# Patient Record
Sex: Male | Born: 1975 | Race: White | Hispanic: No | Marital: Married | Smoking: Never smoker
Health system: Southern US, Community
[De-identification: ages and names within clinical notes are randomized; demographics above are authoritative.]

## PROBLEM LIST (undated history)

## (undated) DIAGNOSIS — K219 Gastro-esophageal reflux disease without esophagitis: Secondary | ICD-10-CM

## (undated) DIAGNOSIS — G4733 Obstructive sleep apnea (adult) (pediatric): Secondary | ICD-10-CM

## (undated) DIAGNOSIS — I10 Essential (primary) hypertension: Secondary | ICD-10-CM

## (undated) HISTORY — PX: TONSILLECTOMY: SUR1361

---

## 2006-05-06 ENCOUNTER — Emergency Department (HOSPITAL_COMMUNITY): Admission: EM | Admit: 2006-05-06 | Discharge: 2006-05-06 | Payer: Self-pay | Admitting: Emergency Medicine

## 2006-06-25 ENCOUNTER — Emergency Department (HOSPITAL_COMMUNITY): Admission: EM | Admit: 2006-06-25 | Discharge: 2006-06-25 | Payer: Self-pay | Admitting: Emergency Medicine

## 2006-10-01 ENCOUNTER — Emergency Department (HOSPITAL_COMMUNITY): Admission: EM | Admit: 2006-10-01 | Discharge: 2006-10-01 | Payer: Self-pay | Admitting: Emergency Medicine

## 2007-08-22 ENCOUNTER — Emergency Department (HOSPITAL_COMMUNITY): Admission: EM | Admit: 2007-08-22 | Discharge: 2007-08-22 | Payer: Self-pay | Admitting: Emergency Medicine

## 2007-10-28 ENCOUNTER — Emergency Department (HOSPITAL_COMMUNITY): Admission: EM | Admit: 2007-10-28 | Discharge: 2007-10-28 | Payer: Self-pay | Admitting: Family Medicine

## 2009-04-15 ENCOUNTER — Emergency Department (HOSPITAL_COMMUNITY): Admission: EM | Admit: 2009-04-15 | Discharge: 2009-04-15 | Payer: Self-pay | Admitting: Emergency Medicine

## 2010-07-15 ENCOUNTER — Emergency Department (HOSPITAL_COMMUNITY)
Admission: EM | Admit: 2010-07-15 | Discharge: 2010-07-15 | Disposition: A | Discharge status: Home / Self Care | Payer: PRIVATE HEALTH INSURANCE | Attending: Emergency Medicine | Admitting: Emergency Medicine

## 2010-07-15 ENCOUNTER — Encounter (HOSPITAL_COMMUNITY): Payer: Self-pay | Admitting: Radiology

## 2010-07-15 ENCOUNTER — Emergency Department (HOSPITAL_COMMUNITY): Payer: PRIVATE HEALTH INSURANCE

## 2010-07-15 DIAGNOSIS — K7689 Other specified diseases of liver: Secondary | ICD-10-CM | POA: Insufficient documentation

## 2010-07-15 DIAGNOSIS — D72829 Elevated white blood cell count, unspecified: Secondary | ICD-10-CM | POA: Insufficient documentation

## 2010-07-15 DIAGNOSIS — R1031 Right lower quadrant pain: Secondary | ICD-10-CM | POA: Insufficient documentation

## 2010-07-15 LAB — COMPREHENSIVE METABOLIC PANEL
ALT: 87 U/L — ABNORMAL HIGH (ref 0–53)
AST: 42 U/L — ABNORMAL HIGH (ref 0–37)
Alkaline Phosphatase: 106 U/L (ref 39–117)
CO2: 24 mEq/L (ref 19–32)
GFR calc Af Amer: >60 mL/min (ref >60)
GFR calc non Af Amer: >60 mL/min (ref >60)
Glucose, Bld: 106 mg/dL — ABNORMAL HIGH (ref 70–99)
Potassium: 3.6 mEq/L (ref 3.5–5.1)
Sodium: 136 mEq/L (ref 135–145)

## 2010-07-15 LAB — URINALYSIS, ROUTINE W REFLEX MICROSCOPIC
Glucose, UA: NEGATIVE mg/dL
Hgb urine dipstick: NEGATIVE
Leukocytes, UA: NEGATIVE
Specific Gravity, Urine: 1.019 (ref 1.005–1.030)
pH: 6 (ref 5.0–8.0)

## 2010-07-15 LAB — CBC
HCT: 44.2 % (ref 39.0–52.0)
MCHC: 33.5 g/dL (ref 30.0–36.0)
MCV: 86.2 fL (ref 78.0–100.0)
Platelets: 210 10*3/uL (ref 150–400)
RDW: 13.3 % (ref 11.5–15.5)
WBC: 11.9 10*3/uL — ABNORMAL HIGH (ref 4.0–10.5)

## 2010-07-15 LAB — DIFFERENTIAL
Basophils Absolute: 0.1 10*3/uL (ref 0.0–0.1)
Eosinophils Absolute: 0.2 10*3/uL (ref 0.0–0.7)
Eosinophils Relative: 2 % (ref 0–5)
Lymphs Abs: 2.9 10*3/uL (ref 0.7–4.0)
Monocytes Absolute: 0.9 10*3/uL (ref 0.1–1.0)

## 2010-07-15 MED ORDER — IOHEXOL 300 MG/ML  SOLN
100.0000 mL | Freq: Once | INTRAMUSCULAR | Status: AC | PRN
  Administered 2010-07-15: 100 mL via INTRAVENOUS

## 2010-07-16 LAB — URINE CULTURE: Culture  Setup Time: 201207020858

## 2011-01-10 ENCOUNTER — Other Ambulatory Visit: Payer: Self-pay | Admitting: Family Medicine

## 2011-01-21 ENCOUNTER — Ambulatory Visit
Admission: RE | Admit: 2011-01-21 | Discharge: 2011-01-21 | Disposition: A | Discharge status: Home / Self Care | Payer: PRIVATE HEALTH INSURANCE | Source: Ambulatory Visit | Attending: Family Medicine | Admitting: Family Medicine

## 2014-09-21 ENCOUNTER — Emergency Department (HOSPITAL_COMMUNITY): Payer: No Typology Code available for payment source

## 2014-09-21 ENCOUNTER — Encounter (HOSPITAL_COMMUNITY): Payer: Self-pay | Admitting: Emergency Medicine

## 2014-09-21 ENCOUNTER — Emergency Department (HOSPITAL_COMMUNITY)
Admission: EM | Admit: 2014-09-21 | Discharge: 2014-09-21 | Disposition: A | Discharge status: Home / Self Care | Payer: No Typology Code available for payment source | Attending: Emergency Medicine | Admitting: Emergency Medicine

## 2014-09-21 DIAGNOSIS — Z79899 Other long term (current) drug therapy: Secondary | ICD-10-CM | POA: Diagnosis not present

## 2014-09-21 DIAGNOSIS — M7732 Calcaneal spur, left foot: Secondary | ICD-10-CM | POA: Insufficient documentation

## 2014-09-21 DIAGNOSIS — M7731 Calcaneal spur, right foot: Secondary | ICD-10-CM | POA: Insufficient documentation

## 2014-09-21 DIAGNOSIS — M79671 Pain in right foot: Secondary | ICD-10-CM | POA: Diagnosis present

## 2014-09-21 MED ORDER — IBUPROFEN 200 MG PO TABS
600.0000 mg | ORAL_TABLET | Freq: Once | ORAL | Status: AC
  Administered 2014-09-21: 600 mg via ORAL
  Filled 2014-09-21: qty 3

## 2014-09-21 MED ORDER — LANSOPRAZOLE 30 MG PO CPDR: 30.0000 mg | DELAYED_RELEASE_CAPSULE | Freq: Every day | ORAL | Status: DC

## 2014-09-21 MED ORDER — IBUPROFEN 600 MG PO TABS: 600.0000 mg | ORAL_TABLET | Freq: Three times a day (TID) | ORAL | Status: DC

## 2014-09-21 NOTE — ED Notes (Signed)
Patient transported to X-ray 

## 2014-09-21 NOTE — ED Notes (Signed)
Pt c/o bilat feet pain, mostly centered around heel area. Worse after working all day. Better with rest.

## 2014-09-21 NOTE — ED Provider Notes (Signed)
CSN: [id number]     Arrival date & time 09/21/14  0327 History   First MD Initiated Contact with Patient 09/21/14 [phone]     Chief Complaint  Patient presents with  . Foot Pain     (Consider location/radiation/quality/duration/timing/severity/associated sxs/prior Treatment) HPI Patient presents with several months of bilateral foot pain. There is no been no acute injury. Pain is worse with prolonged standing. [place] of the [place]. Denies any focal swelling to the area of pain. No redness. No masses. History reviewed. No pertinent past medical history. History reviewed. No pertinent past surgical history. No family history on file. Social History  Substance Use Topics  . Smoking status: Never Smoker   . Smokeless tobacco: None  . Alcohol Use: Yes    Review of Systems  Cardiovascular: Negative for leg swelling.  Musculoskeletal: Positive for arthralgias.  Skin: Negative for rash and wound.  Neurological: Negative for weakness and numbness.  All other systems reviewed and are negative.     Allergies  Milk-related compounds and Wheat bran  Home Medications   Prior to Admission medications   Medication Sig Start Date End Date Taking? Authorizing Provider  Aspirin-Caffeine (BAYER BACK & BODY) 500-32.5 MG TABS Take 2-3 tablets by mouth daily.   Yes Historical Provider, MD  omeprazole (PRILOSEC) 20 MG capsule Take 20 mg by mouth daily.   Yes Historical Provider, MD  ibuprofen (ADVIL,MOTRIN) 600 MG tablet Take 1 tablet (600 mg total) by mouth 3 (three) times daily after meals. 09/21/14   Loren Racer, MD   BP 137/99 mmHg  Pulse 84  Temp(Src) 98.2 F (36.8 C) (Oral)  Resp 16  Ht 6' (1.829 m)  Wt 238 lb (107.956 kg)  BMI 32.27 kg/m2  SpO2 98% Physical Exam  Constitutional: He is oriented to person, place, and time. He appears well-developed and well-nourished. No distress.  HENT:  Head: Normocephalic and atraumatic.  Eyes: Pupils  are equal, round, and reactive to light.  Neck: Normal range of motion. Neck supple.  Cardiovascular: Normal rate.   Pulmonary/Chest: Effort normal.  Abdominal: Soft. Bowel sounds are normal.  Musculoskeletal: Normal range of motion. He exhibits tenderness. He exhibits no edema.  Patient with tenderness to the plantar surface of bilateral feet just proximal to the calcaneal bones. There is no obvious injury. No erythema. No warmth. No masses. Foot is warm. Dorsalis pedis pulses intact. Good cap refill  Neurological: He is alert and oriented to person, place, and time.  Ambulating without difficulty. 5/5 motor in all extremities. Sensation intact.  Skin: Skin is warm and dry. No rash noted. No erythema.  Psychiatric: He has a normal mood and affect. His behavior is normal.  Nursing note and vitals reviewed.   ED Course  Procedures (including critical care time) Labs Review Labs Reviewed - No data to display  Imaging Review Dg Foot Complete Left  09/21/2014   CLINICAL DATA:  Bilateral heel pain for 2 months.  No trauma.  EXAM: LEFT FOOT - COMPLETE 3+ VIEW  COMPARISON:  None.  FINDINGS: There is no evidence of fracture or dislocation. There is no evidence of arthropathy or other focal bone abnormality. Soft tissues are unremarkable. Small plantar calcaneal spur.  IMPRESSION: No acute bony abnormalities.   Electronically Signed   By: Burman Nieves M.D.   On: 09/21/2014 06:15   Dg Foot Complete Right  09/21/2014   CLINICAL DATA:  Bilateral heel pain for 2 months.  No  known trauma.  EXAM: RIGHT FOOT COMPLETE - 3+ VIEW  COMPARISON:  None.  FINDINGS: There is no evidence of fracture or dislocation. There is no evidence of arthropathy or other focal bone abnormality. Soft tissues are unremarkable. Small plantar calcaneal spur.  IMPRESSION: No acute bony abnormalities.   Electronically Signed   By: Burman Nieves M.D.   On: 09/21/2014 06:15   I have personally reviewed and evaluated these images  and lab results as part of my medical decision-making.   EKG Interpretation None      MDM   Final diagnoses:  Calcaneal spur of both feet    Patient advised to follow-up with podiatry.    Loren Racer, MD 09/21/14 [phone]

## 2014-09-21 NOTE — Discharge Instructions (Signed)
Heel Spur A heel spur is a hook of bone that can form on the calcaneus (the heel bone and the largest bone of the foot). Heel spurs are often associated with plantar fasciitis and usually come in people who have had the problem for an extended period of time. The cause of the relationship is unknown. The pain associated with them is thought to be caused by an inflammation (soreness and redness) of the plantar fascia rather than the spur itself. The plantar fascia is a thick fibrous like tissue that runs from the calcaneus (heel bone) to the ball of the foot. This strong, tight tissue helps maintain the arch of your foot. It helps distribute the weight across your foot as you walk or run. Stresses placed on the plantar fascia can be tremendous. When it is inflamed normal activities become painful. Pain is worse in the morning after sleeping. After sleeping the plantar fascia is tight. The first movements stretch the fascia and this causes pain. As the tendon loosens, the pain usually gets better. It often returns with too much standing or walking.  About 70% of patients with plantar fasciitis have a heel spur. About half of people without foot pain also have heel spurs. DIAGNOSIS  The diagnosis of a heel spur is made by X-ray. The X-ray shows a hook of bone protruding from the bottom of the calcaneus at the point where the plantar fascia is attached to the heel bone.  TREATMENT  It is necessary to find out what is causing the stretching of the plantar fascia. If the cause is over-pronation (flat feet), orthotics and proper foot ware may help.  Stretching exercises, losing weight, wearing shoes that have a cushioned heel that absorbs shock, and elevating the heel with the use of a heel cradle, heel cup, or orthotics may all help. Heel cradles and heel cups provide extra comfort and cushion to the heel, and reduce the amount of shock to the sore area. AVOIDING THE PAIN OF PLANTAR FASCIITIS AND HEEL  SPURS  Consult a sports medicine professional before beginning a new exercise program.  Walking programs offer a good workout. There is a lower chance of overuse injuries common to the runners. There is less impact and less jarring of the joints.  Begin all new exercise programs slowly. If problems or pains develop, decrease the amount of time or distance until you are at a comfortable level.  Wear good shoes and replace them regularly.  Stretch your foot and the heel cords at the back of the ankle (Achilles tendons) both before and after exercise.  Run or exercise on even surfaces that are not hard. For example, asphalt is better than pavement.  Do not run barefoot on hard surfaces.  If using a treadmill, vary the incline.  Do not continue to workout if you have foot or joint problems. Seek professional help if they do not improve. HOME CARE INSTRUCTIONS   Avoid activities that cause you pain until you recover.  Use ice or cold packs to the problem or painful areas after working out.  Only take over-the-counter or prescription medicines for pain, discomfort, or fever as directed by your caregiver.  Soft shoe inserts or athletic shoes with air or gel sole cushions may be helpful.  If problems continue or become more severe, consult a sports medicine caregiver. Cortisone is a potent anti-inflammatory medication that may be injected into the painful area. You can discuss this treatment with your caregiver. MAKE SURE YOU:     Understand these instructions.  Will watch your condition.  Will get help right away if you are not doing well or get worse. Document Released: 02/05/2005 Document Revised: 03/24/2011 Document Reviewed: 03/02/2013 [employer] Patient Information 2015 [employer]. This information is not intended to replace advice given to you by your health care provider. Make sure you discuss any questions you have with your health care provider.  

## 2014-09-28 ENCOUNTER — Ambulatory Visit: Payer: Self-pay

## 2014-09-28 ENCOUNTER — Ambulatory Visit (INDEPENDENT_AMBULATORY_CARE_PROVIDER_SITE_OTHER): Payer: PRIVATE HEALTH INSURANCE | Admitting: Podiatry

## 2014-09-28 ENCOUNTER — Encounter: Payer: Self-pay | Admitting: Podiatry

## 2014-09-28 VITALS — BP 128/83 | HR 74 | Resp 16

## 2014-09-28 DIAGNOSIS — M722 Plantar fascial fibromatosis: Secondary | ICD-10-CM

## 2014-09-28 DIAGNOSIS — M79673 Pain in unspecified foot: Secondary | ICD-10-CM

## 2014-09-28 MED ORDER — TRIAMCINOLONE ACETONIDE 10 MG/ML IJ SUSP
10.0000 mg | Freq: Once | INTRAMUSCULAR | Status: AC
  Administered 2014-09-28: 10 mg

## 2014-09-28 NOTE — Patient Instructions (Signed)

## 2014-09-28 NOTE — Progress Notes (Signed)
   Subjective:    Patient ID: Hector Reeves, male    DOB: July 22, 1975, 39 y.o.   MRN: [id number]  HPI  Pt presents with bilateral foot pain in heels and ball of feet, lasting 1 month.   Review of Systems  All other systems reviewed and are negative.      Objective:   Physical Exam        Assessment & Plan:

## 2014-09-29 NOTE — Progress Notes (Signed)
Subjective:     Patient ID: Hector Reeves, male   DOB: [birth year], 39 y.o.   MRN: [id number]  HPI patient presents with pain in his arches of both feet and states that he was on his feet for significant periods of time over the last few months with pain occurring after this activity   Review of Systems  All other systems reviewed and are negative.      Objective:   Physical Exam  Constitutional: He is oriented to person, place, and time.  Cardiovascular: Intact distal pulses.   Musculoskeletal: Normal range of motion.  Neurological: He is oriented to person, place, and time.  Skin: Skin is warm.  Nursing note and vitals reviewed.  neurovascular status intact muscle strength adequate range of motion within normal limits with patient noted to have discomfort in the plantar arch distal to the insertion to the calcaneus bilateral. Moderate depression of the arch is noted and the patient is found to have good digital perfusion and is well oriented 3     Assessment:     Acute plantar fasciitis most likely brought on by foot structure and excessive activity    Plan:     H&P and x-rays reviewed with patient. Distal injections administered bilateral 3 mg Kenalog 5 mg Xylocaine and applied fascial brace bilateral with instructions on usage. Placed on anti-inflammatory and gave instructions on physical therapy

## 2014-10-12 ENCOUNTER — Encounter: Payer: Self-pay | Admitting: Podiatry

## 2014-10-12 ENCOUNTER — Ambulatory Visit (INDEPENDENT_AMBULATORY_CARE_PROVIDER_SITE_OTHER): Payer: No Typology Code available for payment source | Admitting: Podiatry

## 2014-10-12 VITALS — BP 134/93 | HR 68 | Resp 16

## 2014-10-12 DIAGNOSIS — M722 Plantar fascial fibromatosis: Secondary | ICD-10-CM | POA: Diagnosis not present

## 2014-10-12 DIAGNOSIS — M79673 Pain in unspecified foot: Secondary | ICD-10-CM

## 2014-10-12 NOTE — Progress Notes (Signed)
Subjective:     Patient ID: Hector Reeves, male   DOB: 09-07-1975, 39 y.o.   MRN: [id number]  HPI patient states I'm improved but still having some pain especially when I have to stand at work   Review of Systems     Objective:   Physical Exam Neurovascular status intact muscle strength adequate with mid arch pain noted bilateral which is present but improved from previous with moderate depression of the arch noted upon weightbearing    Assessment:     Plantar fasciitis bilateral that's improved mid arch but still painful with pathological structure of foot    Plan:     Reviewed condition and importance of physical therapy not going barefoot and supportive shoe gear usage. Scanned for custom orthotics to reduce the stress on the feet and will be seen back when those are ready or earlier if any issues should occur

## 2014-11-03 ENCOUNTER — Ambulatory Visit: Payer: PRIVATE HEALTH INSURANCE | Admitting: *Deleted

## 2014-11-03 DIAGNOSIS — M722 Plantar fascial fibromatosis: Secondary | ICD-10-CM

## 2014-11-03 NOTE — Progress Notes (Signed)
Patient ID: Hector HalonJason P Reeves, male   DOB: [birth year], 39 y.o.   MRN: [id number] Patient presents for orthotic pick up.  Verbal and written break in and wear instructions given.  Patient will follow up in 4 weeks if symptoms worsen or fail to improve.

## 2014-11-03 NOTE — Patient Instructions (Signed)

## 2014-12-01 ENCOUNTER — Ambulatory Visit (INDEPENDENT_AMBULATORY_CARE_PROVIDER_SITE_OTHER): Payer: PRIVATE HEALTH INSURANCE | Admitting: Podiatry

## 2014-12-01 ENCOUNTER — Encounter: Payer: Self-pay | Admitting: Podiatry

## 2014-12-01 VITALS — BP 157/98 | HR 71 | Resp 16

## 2014-12-01 DIAGNOSIS — M722 Plantar fascial fibromatosis: Secondary | ICD-10-CM

## 2014-12-02 NOTE — Progress Notes (Signed)
Subjective:     Patient ID: Hector Reeves, male   DOB: 09-Jul-1975, 39 y.o.   MRN: [id number]  HPI patient states my heels are feeling quite a bit better with diminished discomfort and inability to walk distances without pain   Review of Systems     Objective:   Physical Exam Neurovascular status intact muscle strength adequate range of motion within normal limits with patient noted to have significant diminishment of discomfort in the heel region bilateral and into the arch    Assessment:     Plantar fascial symptomatology bilateral which is continuing to improve with treatment    Plan:     Advised on physical therapy anti-inflammatory therapy shoe gear modifications and continued orthotic usage. Patient will be seen back as needed or if symptoms were to reoccur

## 2015-12-17 ENCOUNTER — Encounter (HOSPITAL_COMMUNITY): Payer: Self-pay | Admitting: *Deleted

## 2015-12-17 ENCOUNTER — Emergency Department (HOSPITAL_COMMUNITY)
Admission: EM | Admit: 2015-12-17 | Discharge: 2015-12-17 | Disposition: A | Discharge status: Home / Self Care | Payer: No Typology Code available for payment source | Attending: Emergency Medicine | Admitting: Emergency Medicine

## 2015-12-17 DIAGNOSIS — Y999 Unspecified external cause status: Secondary | ICD-10-CM | POA: Insufficient documentation

## 2015-12-17 DIAGNOSIS — Z79899 Other long term (current) drug therapy: Secondary | ICD-10-CM | POA: Insufficient documentation

## 2015-12-17 DIAGNOSIS — Z7982 Long term (current) use of aspirin: Secondary | ICD-10-CM | POA: Diagnosis not present

## 2015-12-17 DIAGNOSIS — S299XXA Unspecified injury of thorax, initial encounter: Secondary | ICD-10-CM | POA: Diagnosis present

## 2015-12-17 DIAGNOSIS — Y929 Unspecified place or not applicable: Secondary | ICD-10-CM | POA: Insufficient documentation

## 2015-12-17 DIAGNOSIS — S29012A Strain of muscle and tendon of back wall of thorax, initial encounter: Secondary | ICD-10-CM | POA: Insufficient documentation

## 2015-12-17 DIAGNOSIS — Y939 Activity, unspecified: Secondary | ICD-10-CM | POA: Insufficient documentation

## 2015-12-17 DIAGNOSIS — X509XXA Other and unspecified overexertion or strenuous movements or postures, initial encounter: Secondary | ICD-10-CM | POA: Diagnosis not present

## 2015-12-17 DIAGNOSIS — T148XXA Other injury of unspecified body region, initial encounter: Secondary | ICD-10-CM

## 2015-12-17 MED ORDER — METHOCARBAMOL 500 MG PO TABS: 500.0000 mg | ORAL_TABLET | Freq: Two times a day (BID) | ORAL | 0 refills | Status: AC

## 2015-12-17 NOTE — ED Triage Notes (Signed)
Pt c/o right lower back pain. Denies any other symptoms

## 2015-12-17 NOTE — ED Provider Notes (Signed)
[facility]-EMERGENCY DEPT Provider Note   CSN: [id number] Arrival date & time: 12/17/15  0334     History   Chief Complaint Chief Complaint  Patient presents with  . Back Pain    HPI Hector Reeves is a 40 y.o. male.  Patient presents with complaint of right sided back pain that started yesterday when he felt a pull while rolling out of bed. Pain has been progressive to now when he feels a sharp pain with any movement. No radiation of the pain. He reports similar symptoms in the past diagnosed as "muscle strain". He has been taking Aleve without relief. No hematuria, dysuria, penile discharge or testicular pain.    The history is provided by the patient. No language interpreter was used.    History reviewed. No pertinent past medical history.  There are no active problems to display for this patient.   History reviewed. No pertinent surgical history.     Home Medications    Prior to Admission medications   Medication Sig Start Date End Date Taking? Authorizing Provider  Aspirin-Caffeine (BAYER BACK & BODY) 500-32.5 MG TABS Take 2-3 tablets by mouth daily.    Historical Provider, MD  ibuprofen (ADVIL,MOTRIN) 600 MG tablet Take 1 tablet (600 mg total) by mouth 3 (three) times daily after meals. 09/21/14   Loren Raceravid Yelverton, MD  omeprazole (PRILOSEC) 20 MG capsule Take 20 mg by mouth daily.    Historical Provider, MD    Family History No family history on file.  Social History Social History  Substance Use Topics  . Smoking status: Never Smoker  . Smokeless tobacco: Never Used  . Alcohol use Yes     Allergies   Milk-related compounds and Wheat bran   Review of Systems Review of Systems  Constitutional: Negative for chills and fever.  HENT: Negative.   Respiratory: Negative.  Negative for shortness of breath.   Cardiovascular: Negative.   Gastrointestinal: Negative.  Negative for abdominal pain, constipation, diarrhea, nausea and vomiting.  Genitourinary:  Negative for difficulty urinating, dysuria and hematuria.  Musculoskeletal: Positive for back pain.  Skin: Negative.   Neurological: Negative.  Negative for weakness and numbness.     Physical Exam Updated Vital Signs BP 146/97   Pulse 69   Temp 98.3 F (36.8 C)   Resp 16   Ht 6' (1.829 m)   Wt 104.3 kg   SpO2 100%   BMI 31.19 kg/m   Physical Exam  Constitutional: He is oriented to person, place, and time. He appears well-developed and well-nourished.  Neck: Normal range of motion.  Pulmonary/Chest: Effort normal.  Abdominal: There is no tenderness. There is no guarding.  Musculoskeletal: Normal range of motion.       Arms: Neurological: He is alert and oriented to person, place, and time.  Skin: Skin is warm and dry.  Psychiatric: He has a normal mood and affect.     ED Treatments / Results  Labs (all labs ordered are listed, but only abnormal results are displayed) Labs Reviewed - No data to display  EKG  EKG Interpretation None       Radiology No results found.  Procedures Procedures (including critical care time)  Medications Ordered in ED Medications - No data to display   Initial Impression / Assessment and Plan / ED Course  I have reviewed the triage vital signs and the nursing notes.  Pertinent labs & imaging results that were available during my care of the patient were reviewed by me  and considered in my medical decision making (see chart for details).  Clinical Course     Patient presents with back pain on right lateral mid-back. No heavy lifting or known specific injury. Pain is worse with movement, better with rest in certain positions. No ss/sxs of kidney stone, or evidence concerning UTI or infection. He can be discharged home with PCP follow up as needed.  Final Clinical Impressions(s) / ED Diagnoses   Final diagnoses:  None   1. Back pain  New Prescriptions New Prescriptions   No medications on file     Danne HarborShari Dalonte Hardage,  PA-C 12/17/15 [phone]    April Palumbo, MD 12/17/15 [phone]

## 2017-01-22 ENCOUNTER — Emergency Department (HOSPITAL_COMMUNITY): Payer: No Typology Code available for payment source

## 2017-01-22 ENCOUNTER — Emergency Department (HOSPITAL_COMMUNITY)
Admission: EM | Admit: 2017-01-22 | Discharge: 2017-01-22 | Disposition: A | Discharge status: Home / Self Care | Payer: No Typology Code available for payment source | Attending: Emergency Medicine | Admitting: Emergency Medicine

## 2017-01-22 ENCOUNTER — Encounter (HOSPITAL_COMMUNITY): Payer: Self-pay | Admitting: Emergency Medicine

## 2017-01-22 DIAGNOSIS — S20212A Contusion of left front wall of thorax, initial encounter: Secondary | ICD-10-CM | POA: Diagnosis not present

## 2017-01-22 DIAGNOSIS — Y9239 Other specified sports and athletic area as the place of occurrence of the external cause: Secondary | ICD-10-CM | POA: Insufficient documentation

## 2017-01-22 DIAGNOSIS — Y9302 Activity, running: Secondary | ICD-10-CM | POA: Insufficient documentation

## 2017-01-22 DIAGNOSIS — W01198A Fall on same level from slipping, tripping and stumbling with subsequent striking against other object, initial encounter: Secondary | ICD-10-CM | POA: Diagnosis not present

## 2017-01-22 DIAGNOSIS — S301XXA Contusion of abdominal wall, initial encounter: Secondary | ICD-10-CM | POA: Insufficient documentation

## 2017-01-22 DIAGNOSIS — S3991XA Unspecified injury of abdomen, initial encounter: Secondary | ICD-10-CM | POA: Diagnosis present

## 2017-01-22 DIAGNOSIS — Y999 Unspecified external cause status: Secondary | ICD-10-CM | POA: Diagnosis not present

## 2017-01-22 LAB — COMPREHENSIVE METABOLIC PANEL
ALT: 37 U/L (ref 17–63)
AST: 53 U/L — AB (ref 15–41)
Albumin: 4.6 g/dL (ref 3.5–5.0)
Alkaline Phosphatase: 64 U/L (ref 38–126)
Anion gap: 7 (ref 5–15)
BUN: 15 mg/dL (ref 6–20)
CHLORIDE: 105 mmol/L (ref 101–111)
CO2: 26 mmol/L (ref 22–32)
CREATININE: 1.11 mg/dL (ref 0.61–1.24)
Calcium: 9.1 mg/dL (ref 8.9–10.3)
GFR calc Af Amer: >60 mL/min (ref >60)
GLUCOSE: 105 mg/dL — AB (ref 65–99)
Potassium: 3.7 mmol/L (ref 3.5–5.1)
Sodium: 138 mmol/L (ref 135–145)
Total Bilirubin: 0.8 mg/dL (ref 0.3–1.2)
Total Protein: 8.2 g/dL — ABNORMAL HIGH (ref 6.5–8.1)

## 2017-01-22 LAB — URINALYSIS, ROUTINE W REFLEX MICROSCOPIC
Bilirubin Urine: NEGATIVE
GLUCOSE, UA: NEGATIVE mg/dL
Hgb urine dipstick: NEGATIVE
Ketones, ur: NEGATIVE mg/dL
LEUKOCYTES UA: NEGATIVE
Nitrite: NEGATIVE
PH: 5 (ref 5.0–8.0)
PROTEIN: NEGATIVE mg/dL
Specific Gravity, Urine: 1.018 (ref 1.005–1.030)

## 2017-01-22 LAB — CBC WITH DIFFERENTIAL/PLATELET
Basophils Absolute: 0 10*3/uL (ref 0.0–0.1)
Basophils Relative: 0 %
EOS ABS: 0.2 10*3/uL (ref 0.0–0.7)
EOS PCT: 2 %
HCT: 43.7 % (ref 39.0–52.0)
Hemoglobin: 14 g/dL (ref 13.0–17.0)
LYMPHS ABS: 0.8 10*3/uL (ref 0.7–4.0)
Lymphocytes Relative: 10 %
MCH: 27.1 pg (ref 26.0–34.0)
MCHC: 32 g/dL (ref 30.0–36.0)
MCV: 84.7 fL (ref 78.0–100.0)
MONO ABS: 0.7 10*3/uL (ref 0.1–1.0)
MONOS PCT: 8 %
Neutro Abs: 6.5 10*3/uL (ref 1.7–7.7)
Neutrophils Relative %: 80 %
PLATELETS: 202 10*3/uL (ref 150–400)
RBC: 5.16 MIL/uL (ref 4.22–5.81)
RDW: 14.3 % (ref 11.5–15.5)
WBC: 8.2 10*3/uL (ref 4.0–10.5)

## 2017-01-22 MED ORDER — HYDROCODONE-ACETAMINOPHEN 5-325 MG PO TABS: 1.0000 | ORAL_TABLET | ORAL | 0 refills | Status: AC | PRN

## 2017-01-22 MED ORDER — IBUPROFEN 600 MG PO TABS: 600.0000 mg | ORAL_TABLET | Freq: Four times a day (QID) | ORAL | 0 refills | Status: AC | PRN

## 2017-01-22 MED ORDER — IOPAMIDOL (ISOVUE-370) INJECTION 76%: 100.0000 mL | Freq: Once | INTRAVENOUS | Status: DC | PRN

## 2017-01-22 MED ORDER — KETOROLAC TROMETHAMINE 30 MG/ML IJ SOLN
30.0000 mg | Freq: Once | INTRAMUSCULAR | Status: AC
  Administered 2017-01-22: 30 mg via INTRAVENOUS
  Filled 2017-01-22: qty 1

## 2017-01-22 MED ORDER — IOPAMIDOL (ISOVUE-300) INJECTION 61%
INTRAVENOUS | Status: AC
  Administered 2017-01-22: 100 mL
  Filled 2017-01-22: qty 100

## 2017-01-22 NOTE — ED Triage Notes (Signed)
Patient here from home with complaints of fall 2 days ago at gym. Reports left side flank pain radiating into abdomen and lower back pain. Increased with movement. Denies n/v. Denies hitting head.

## 2017-01-22 NOTE — ED Provider Notes (Signed)
[facility]-EMERGENCY DEPT Provider Note   CSN: [id number] Arrival date & time: 01/22/17  0221     History   Chief Complaint Chief Complaint  Patient presents with  . Back Pain  . Flank Pain    HPI Hector Reeves is a 42 y.o. male.  Pt presents to the ED today with abdominal pain s/p fall.  Pt said he was running at the gym on 1/7 and tripped.  He landed hard on his abdomen.  Initially, it was not too bad, but it became much worse.  It is hard to more and to take a deep breath.      History reviewed. No pertinent past medical history.  There are no active problems to display for this patient.   History reviewed. No pertinent surgical history.     Home Medications    Prior to Admission medications   Medication Sig Start Date End Date Taking? Authorizing Provider  naproxen sodium (ALEVE) 220 MG tablet Take 220 mg by mouth daily as needed (pain).   Yes [provider]  omeprazole (PRILOSEC) 20 MG capsule Take 20 mg by mouth daily.   Yes [provider]  HYDROcodone-acetaminophen (NORCO/VICODIN) 5-325 MG tablet Take 1 tablet by mouth every 4 (four) hours as needed. 01/22/17   Jacalyn Lefevre, MD  HYDROcodone-acetaminophen (NORCO/VICODIN) 5-325 MG tablet Take 1 tablet by mouth every 4 (four) hours as needed. 01/22/17   Jacalyn Lefevre, MD  ibuprofen (ADVIL,MOTRIN) 600 MG tablet Take 1 tablet (600 mg total) by mouth every 6 (six) hours as needed. 01/22/17   Jacalyn Lefevre, MD  methocarbamol (ROBAXIN) 500 MG tablet Take 1 tablet (500 mg total) by mouth 2 (two) times daily. Patient not taking: Reported on 01/22/2017 12/17/15   Elpidio Anis, PA-C    Family History No family history on file.  Social History Social History   Tobacco Use  . Smoking status: Never Smoker  . Smokeless tobacco: Never Used  Substance Use Topics  . Alcohol use: Yes  . Drug use: No     Allergies   Milk-related compounds and Wheat bran   Review of  Systems Review of Systems  Gastrointestinal: Positive for abdominal pain.  All other systems reviewed and are negative.    Physical Exam Updated Vital Signs BP (!) 142/96 (BP Location: Left Arm)   Pulse 66   Temp 98.3 F (36.8 C) (Oral)   Resp 19   SpO2 98%   Physical Exam  Constitutional: He is oriented to person, place, and time. He appears well-developed and well-nourished.  HENT:  Head: Normocephalic and atraumatic.  Right Ear: External ear normal.  Left Ear: External ear normal.  Nose: Nose normal.  Mouth/Throat: Oropharynx is clear and moist.  Eyes: Conjunctivae and EOM are normal. Pupils are equal, round, and reactive to light.  Neck: Normal range of motion. Neck supple.  Cardiovascular: Normal rate, regular rhythm, normal heart sounds and intact distal pulses.  Pulmonary/Chest: Effort normal and breath sounds normal.  Abdominal: Soft. Bowel sounds are normal. There is tenderness in the left upper quadrant.  Musculoskeletal: Normal range of motion.  Neurological: He is alert and oriented to person, place, and time.  Skin: Skin is warm and dry. Capillary refill takes less than 2 seconds.  Psychiatric: He has a normal mood and affect. His behavior is normal. Judgment and thought content normal.  Nursing note and vitals reviewed.    ED Treatments / Results  Labs (all labs ordered are listed, but  only abnormal results are displayed) Labs Reviewed  COMPREHENSIVE METABOLIC PANEL - Abnormal; Notable for the following components:      Result Value   Glucose, Bld 105 (*)    Total Protein 8.2 (*)    AST 53 (*)    All other components within normal limits  URINALYSIS, ROUTINE W REFLEX MICROSCOPIC  CBC WITH DIFFERENTIAL/PLATELET    EKG  EKG Interpretation None       Radiology Ct Abdomen Pelvis W Contrast  Result Date: 01/22/2017 CLINICAL DATA:  42 year old male with blunt abdominal trauma. EXAM: CT ABDOMEN AND PELVIS WITH CONTRAST TECHNIQUE: Multidetector CT  imaging of the abdomen and pelvis was performed using the standard protocol following bolus administration of intravenous contrast. CONTRAST:  100 cc Isovue 370 COMPARISON:  CT dated 07/15/2010 FINDINGS: Lower chest: Minimal left lung base atelectasis/scarring. There is a 3 mm nodule in the right middle lobe. No intra-abdominal free air or free fluid. Hepatobiliary: No focal liver abnormality is seen. No gallstones, gallbladder wall thickening, or biliary dilatation. Pancreas: Unremarkable. No pancreatic ductal dilatation or surrounding inflammatory changes. Spleen: Normal in size without focal abnormality. Adrenals/Urinary Tract: Adrenal glands are unremarkable. Kidneys are normal, without renal calculi, focal lesion, or hydronephrosis. Bladder is unremarkable. Stomach/Bowel: Stomach is within normal limits. Appendix appears normal. No evidence of bowel wall thickening, distention, or inflammatory changes. Vascular/Lymphatic: No significant vascular findings are present. No enlarged abdominal or pelvic lymph nodes. Reproductive: The prostate and seminal vesicles are grossly unremarkable. Other: Small fat containing umbilical hernia. Musculoskeletal: Mild degenerative changes and multiple Schmorl nodes. No acute osseous pathology. IMPRESSION: No acute/traumatic intra-abdominal or pelvic pathology. Electronically Signed   By: Elgie CollardArash  Radparvar M.D.   On: 01/22/2017 07:05    Procedures Procedures (including critical care time)  Medications Ordered in ED Medications  iopamidol (ISOVUE-370) 76 % injection 100 mL (not administered)  ketorolac (TORADOL) 30 MG/ML injection 30 mg (30 mg Intravenous Given 01/22/17 0552)  iopamidol (ISOVUE-300) 61 % injection (100 mLs  Contrast Given 01/22/17 0647)     Initial Impression / Assessment and Plan / ED Course  I have reviewed the triage vital signs and the nursing notes.  Pertinent labs & imaging results that were available during my care of the patient were reviewed  by me and considered in my medical decision making (see chart for details).     Pt is feeling better.  No internal injury.  Pt is stable for d/c.  Return if worse.  Final Clinical Impressions(s) / ED Diagnoses   Final diagnoses:  Contusion of abdominal wall, initial encounter  Contusion of rib on left side, initial encounter    ED Discharge Orders        Ordered    HYDROcodone-acetaminophen (NORCO/VICODIN) 5-325 MG tablet  Every 4 hours PRN     01/22/17 0709    HYDROcodone-acetaminophen (NORCO/VICODIN) 5-325 MG tablet  Every 4 hours PRN     01/22/17 0713    ibuprofen (ADVIL,MOTRIN) 600 MG tablet  Every 6 hours PRN     01/22/17 0713       Jacalyn LefevreHaviland, Aislinn Feliz, MD 01/22/17 0715

## 2017-01-22 NOTE — ED Notes (Signed)
Patient transported to CT 

## 2017-03-04 IMAGING — CR DG FOOT COMPLETE 3+V*L*
3 series · 3 of 3 positions shown · non-contrast
Comparison: None.

CLINICAL DATA: Bilateral heel pain for 2 months.  No trauma.

EXAM:
LEFT FOOT - COMPLETE 3+ VIEW

[x foot ap left]
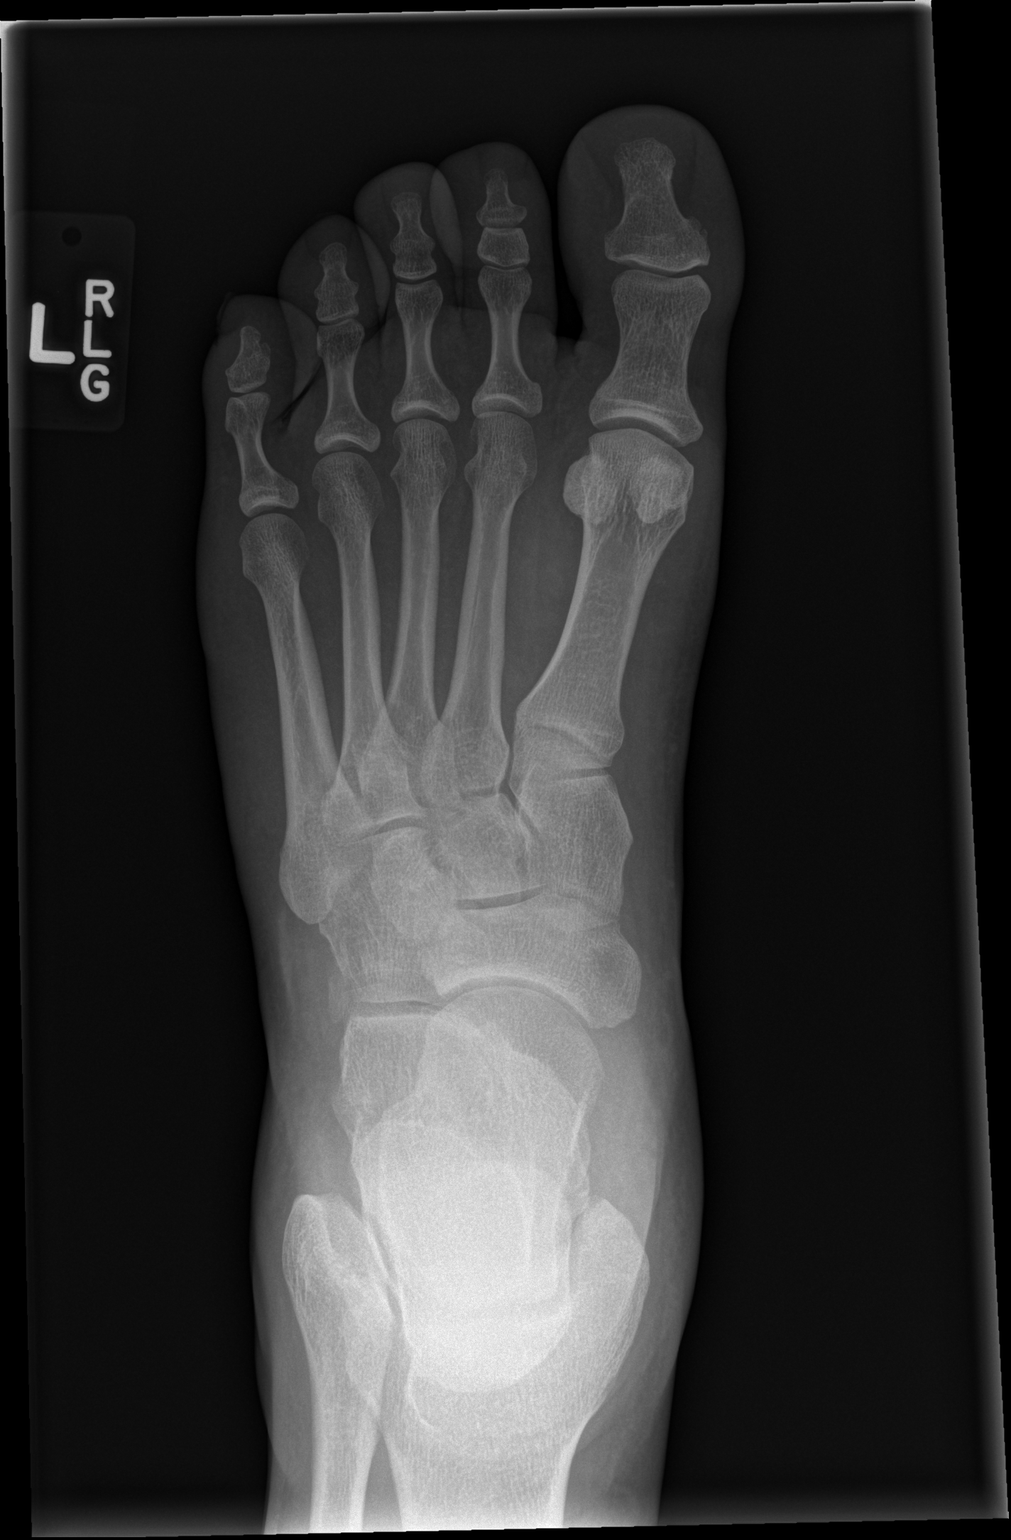

[x foot obl left]
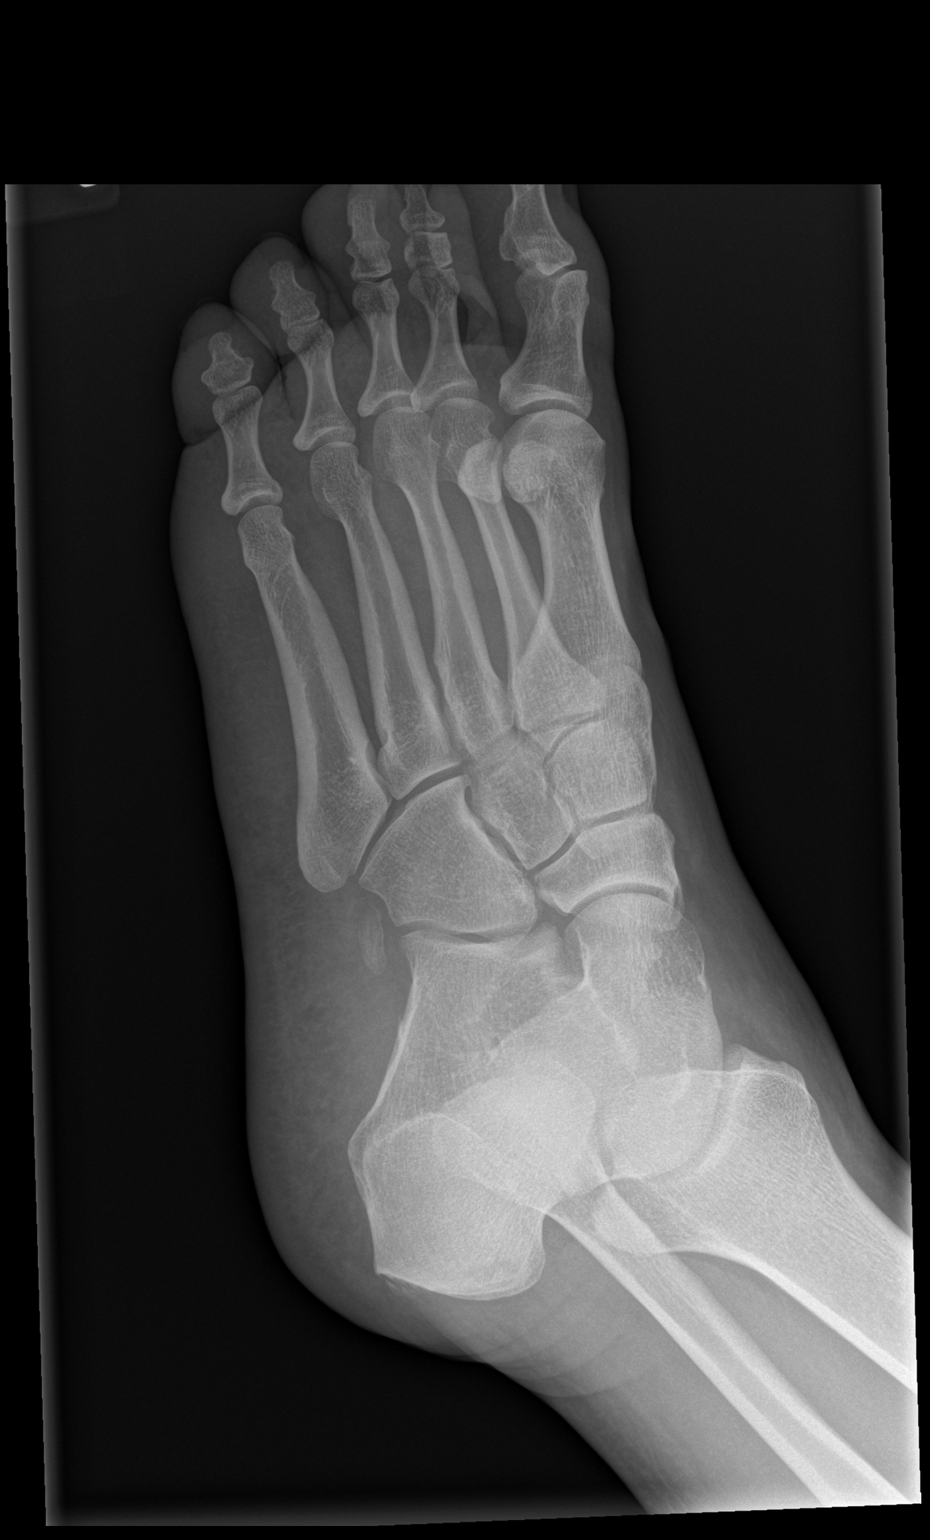

[x foot lat left]
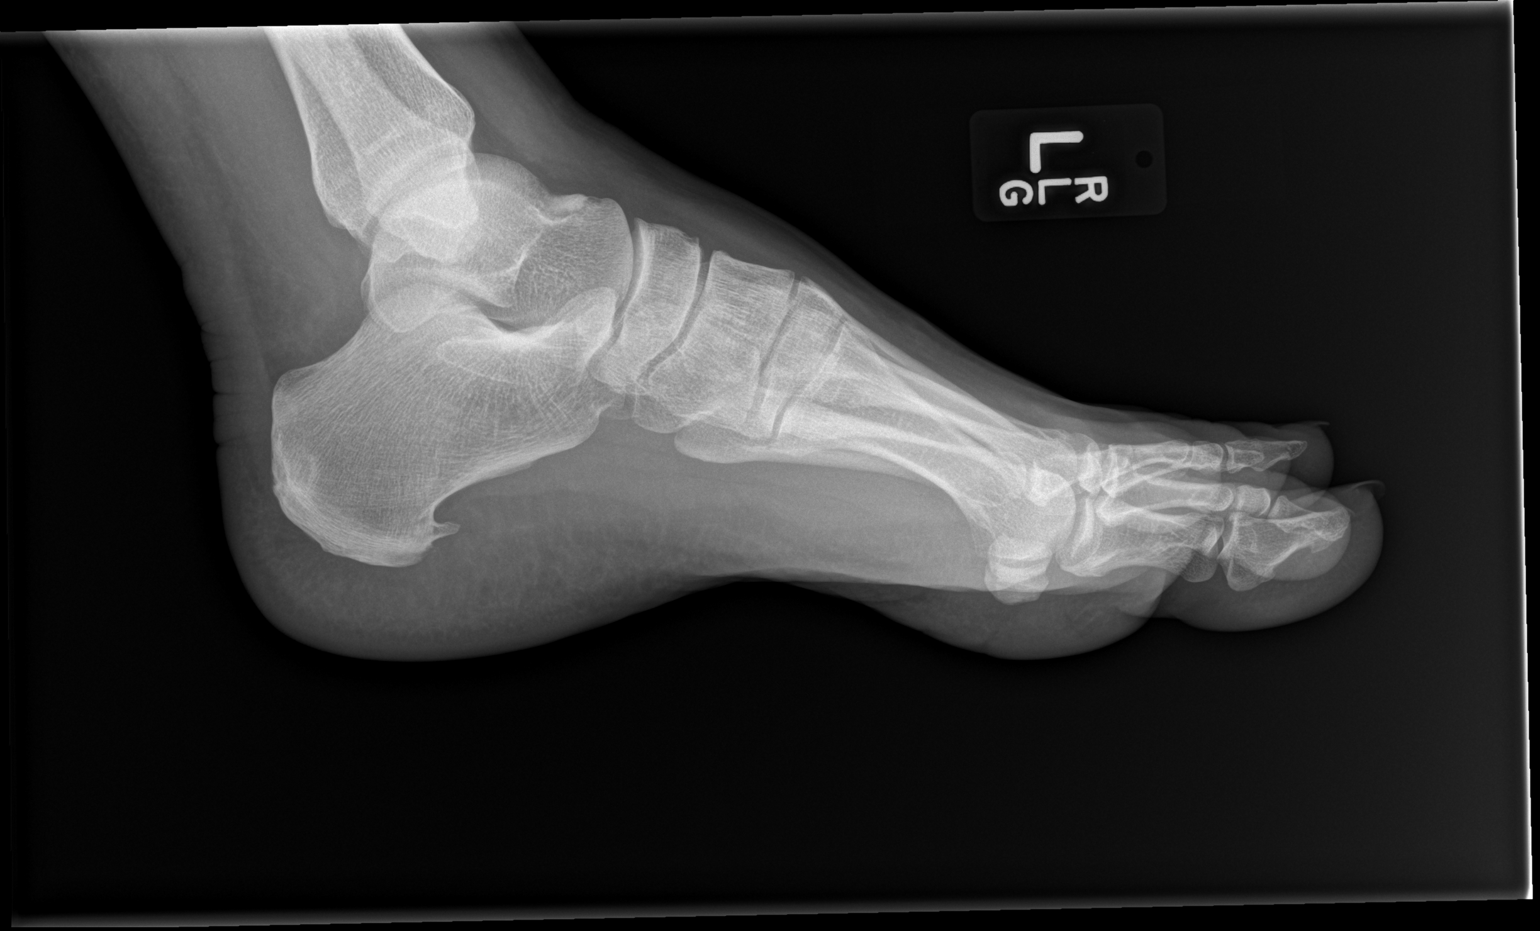

[3 of 3 positions shown; findings below may reference images not displayed]

FINDINGS: There is no evidence of fracture or dislocation. There is no
evidence of arthropathy or other focal bone abnormality. Soft
tissues are unremarkable. Small plantar calcaneal spur.
IMPRESSION: No acute bony abnormalities.

## 2017-03-31 ENCOUNTER — Other Ambulatory Visit: Payer: Self-pay

## 2017-03-31 ENCOUNTER — Emergency Department (HOSPITAL_COMMUNITY)
Admission: EM | Admit: 2017-03-31 | Discharge: 2017-03-31 | Disposition: A | Discharge status: Home / Self Care | Payer: No Typology Code available for payment source | Attending: Emergency Medicine | Admitting: Emergency Medicine

## 2017-03-31 DIAGNOSIS — R112 Nausea with vomiting, unspecified: Secondary | ICD-10-CM

## 2017-03-31 DIAGNOSIS — N179 Acute kidney failure, unspecified: Secondary | ICD-10-CM | POA: Diagnosis not present

## 2017-03-31 DIAGNOSIS — Z79899 Other long term (current) drug therapy: Secondary | ICD-10-CM | POA: Insufficient documentation

## 2017-03-31 DIAGNOSIS — R197 Diarrhea, unspecified: Secondary | ICD-10-CM

## 2017-03-31 LAB — CBC
HCT: 48.4 % (ref 39.0–52.0)
Hemoglobin: 16.1 g/dL (ref 13.0–17.0)
MCH: 28.6 pg (ref 26.0–34.0)
MCHC: 33.3 g/dL (ref 30.0–36.0)
MCV: 86 fL (ref 78.0–100.0)
PLATELETS: 217 10*3/uL (ref 150–400)
RBC: 5.63 MIL/uL (ref 4.22–5.81)
RDW: 14.1 % (ref 11.5–15.5)
WBC: 11.4 10*3/uL — AB (ref 4.0–10.5)

## 2017-03-31 LAB — I-STAT CG4 LACTIC ACID, ED: Lactic Acid, Venous: 1.04 mmol/L (ref 0.5–1.9)

## 2017-03-31 LAB — COMPREHENSIVE METABOLIC PANEL
ALK PHOS: 86 U/L (ref 38–126)
ALT: 31 U/L (ref 17–63)
AST: 25 U/L (ref 15–41)
Albumin: 4.4 g/dL (ref 3.5–5.0)
Anion gap: 10 (ref 5–15)
BILIRUBIN TOTAL: 1 mg/dL (ref 0.3–1.2)
BUN: 24 mg/dL — AB (ref 6–20)
CALCIUM: 8.9 mg/dL (ref 8.9–10.3)
CO2: 25 mmol/L (ref 22–32)
CREATININE: 1.49 mg/dL — AB (ref 0.61–1.24)
Chloride: 104 mmol/L (ref 101–111)
GFR calc Af Amer: >60 mL/min (ref >60)
GFR, EST NON AFRICAN AMERICAN: 56 mL/min — AB (ref >60)
Glucose, Bld: 123 mg/dL — ABNORMAL HIGH (ref 65–99)
Potassium: 4 mmol/L (ref 3.5–5.1)
Sodium: 139 mmol/L (ref 135–145)
Total Protein: 8 g/dL (ref 6.5–8.1)

## 2017-03-31 LAB — URINALYSIS, ROUTINE W REFLEX MICROSCOPIC
Bilirubin Urine: NEGATIVE
Glucose, UA: NEGATIVE mg/dL
HGB URINE DIPSTICK: NEGATIVE
KETONES UR: 5 mg/dL — AB
LEUKOCYTES UA: NEGATIVE
Nitrite: NEGATIVE
PROTEIN: NEGATIVE mg/dL
Specific Gravity, Urine: 1.029 (ref 1.005–1.030)
pH: 5 (ref 5.0–8.0)

## 2017-03-31 LAB — LIPASE, BLOOD: Lipase: 35 U/L (ref 11–51)

## 2017-03-31 MED ORDER — SODIUM CHLORIDE 0.9 % IV BOLUS (SEPSIS)
2000.0000 mL | Freq: Once | INTRAVENOUS | Status: AC
  Administered 2017-03-31: 2000 mL via INTRAVENOUS

## 2017-03-31 MED ORDER — ONDANSETRON HCL 4 MG/2ML IJ SOLN
4.0000 mg | Freq: Once | INTRAMUSCULAR | Status: AC
Start: 2017-03-31 — End: 2017-03-31
  Administered 2017-03-31: 4 mg via INTRAVENOUS
  Filled 2017-03-31: qty 2

## 2017-03-31 MED ORDER — FAMOTIDINE IN NACL 20-0.9 MG/50ML-% IV SOLN
20.0000 mg | Freq: Once | INTRAVENOUS | Status: AC
  Administered 2017-03-31: 20 mg via INTRAVENOUS
  Filled 2017-03-31: qty 50

## 2017-03-31 MED ORDER — ONDANSETRON 4 MG PO TBDP: ORAL_TABLET | ORAL | 0 refills | Status: AC

## 2017-03-31 NOTE — ED Provider Notes (Signed)
[facility]-EMERGENCY DEPT Provider Note   CSN: [id number] Arrival date & time: 03/31/17  1417     History   Chief Complaint Chief Complaint  Patient presents with  . Nausea  . Emesis  . Diarrhea    HPI   Pulse (!) 113, temperature 99.3 F (37.4 C), temperature source Oral, resp. rate 16, height 6' (1.829 m), weight 107.5 kg (237 lb), SpO2 99 %.  Hector Reeves is a 42 y.o. male complaining of multiple episodes of nonbloody, nonbilious, non-coffee-ground emesis with associated diarrhea onset last night at about 8 PM after eating [place] food.  His girlfriend who is with him ate essentially the same food is him and she is not sick.  He had a low-grade temperature of 100.1 at [facility] walk-in clinic.  He had Zofran early this morning and a shot of Phenergan at PCP and he continues to be nauseous but having no vomiting.  He denies any abdominal pain, change in urination.  No past medical history on file.  There are no active problems to display for this patient.   No past surgical history on file.     Home Medications    Prior to Admission medications   Medication Sig Start Date End Date Taking? Authorizing Provider  naproxen sodium (ALEVE) 220 MG tablet Take 220 mg by mouth daily as needed (pain).   Yes [provider]  omeprazole (PRILOSEC) 20 MG capsule Take 20 mg by mouth daily.   Yes [provider]  HYDROcodone-acetaminophen (NORCO/VICODIN) 5-325 MG tablet Take 1 tablet by mouth every 4 (four) hours as needed. Patient not taking: Reported on 03/31/2017 01/22/17   Jacalyn Lefevre, MD  HYDROcodone-acetaminophen (NORCO/VICODIN) 5-325 MG tablet Take 1 tablet by mouth every 4 (four) hours as needed. Patient not taking: Reported on 03/31/2017 01/22/17   Jacalyn Lefevre, MD  ibuprofen (ADVIL,MOTRIN) 600 MG tablet Take 1 tablet (600 mg total) by mouth every 6 (six) hours as needed. Patient not taking: Reported on 03/31/2017 01/22/17   Jacalyn Lefevre, MD  methocarbamol (ROBAXIN) 500 MG tablet Take 1 tablet (500 mg total) by mouth 2 (two) times daily. Patient not taking: Reported on 03/31/2017 12/17/15   Elpidio Anis, PA-C  ondansetron [facility] ODT) 4 MG disintegrating tablet 4mg  ODT q4 hours prn nausea/vomit 03/31/17   Kinsley Holderman, Joni Reining, PA-C    Family History No family history on file.  Social History Social History   Tobacco Use  . Smoking status: Never Smoker  . Smokeless tobacco: Never Used  Substance Use Topics  . Alcohol use: Yes  . Drug use: No     Allergies   Milk-related compounds and Wheat bran   Review of Systems Review of Systems  A complete review of systems was obtained and all systems are negative except as noted in the HPI and PMH.   Physical Exam Updated Vital Signs BP 127/86   Pulse 86   Temp 99.3 F (37.4 C) (Oral)   Resp 16   Ht 6' (1.829 m)   Wt 107.5 kg (237 lb)   SpO2 98%   BMI 32.14 kg/m   Physical Exam  Constitutional: He is oriented to person, place, and time. He appears well-developed and well-nourished. No distress.  HENT:  Head: Normocephalic and atraumatic.  Mouth/Throat: Oropharynx is clear and moist.  Eyes: Conjunctivae and EOM are normal. Pupils are equal, round, and reactive to light.  Neck: Normal range of motion.  Cardiovascular: Normal rate, regular rhythm and intact distal pulses.  Pulmonary/Chest: Effort normal and breath sounds normal.  Abdominal: Soft. There is no tenderness.  Musculoskeletal: Normal range of motion.  Neurological: He is alert and oriented to person, place, and time.  Skin: He is not diaphoretic.  Psychiatric: He has a normal mood and affect.  Nursing note and vitals reviewed.    ED Treatments / Results  Labs (all labs ordered are listed, but only abnormal results are displayed) Labs Reviewed  COMPREHENSIVE METABOLIC PANEL - Abnormal; Notable for the following components:      Result Value   Glucose, Bld 123 (*)    BUN 24 (*)     Creatinine, Ser 1.49 (*)    GFR calc non Af Amer 56 (*)    All other components within normal limits  CBC - Abnormal; Notable for the following components:   WBC 11.4 (*)    All other components within normal limits  URINALYSIS, ROUTINE W REFLEX MICROSCOPIC - Abnormal; Notable for the following components:   Ketones, ur 5 (*)    All other components within normal limits  GASTROINTESTINAL PANEL BY PCR, STOOL (REPLACES STOOL CULTURE)  LIPASE, BLOOD  I-STAT CG4 LACTIC ACID, ED    EKG  EKG Interpretation None       Radiology No results found.  Procedures Procedures (including critical care time)  Medications Ordered in ED Medications  sodium chloride 0.9 % bolus 2,000 mL (0 mLs Intravenous Stopped 03/31/17 2048)  ondansetron (ZOFRAN) injection 4 mg (4 mg Intravenous Given 03/31/17 1913)  famotidine (PEPCID) IVPB 20 mg premix (0 mg Intravenous Stopped 03/31/17 1946)     Initial Impression / Assessment and Plan / ED Course  I have reviewed the triage vital signs and the nursing notes.  Pertinent labs & imaging results that were available during my care of the patient were reviewed by me and considered in my medical decision making (see chart for details).     Vitals:   03/31/17 1527 03/31/17 1529 03/31/17 1906 03/31/17 2045  BP:   122/87 127/86  Pulse:  (!) 113 98 86  Resp:  16 15 16   Temp:  99.3 F (37.4 C)    TempSrc:  Oral    SpO2:  99% 100% 98%  Weight: 107.5 kg (237 lb)     Height: 6' (1.829 m)       Medications  sodium chloride 0.9 % bolus 2,000 mL (0 mLs Intravenous Stopped 03/31/17 2048)  ondansetron (ZOFRAN) injection 4 mg (4 mg Intravenous Given 03/31/17 1913)  famotidine (PEPCID) IVPB 20 mg premix (0 mg Intravenous Stopped 03/31/17 1946)    Hector Reeves is 42 y.o. male presenting with diarrhea yesterday evening, he is not tolerating p.o.'s.  He is having a low-grade fever, no abdominal pain, abdominal exam is benign.  Patient with a mild AK I,  creatinine of 1.49, hemoconcentrated with a BUN of 24.  5 ketones in the urine.  Patient is tolerating p.o.'s, feels much better after hydration.  Repeat abdominal exam is benign, GI by PCR pending.  Will follow with primary care for recheck  Evaluation does not show pathology that would require ongoing emergent intervention or inpatient treatment. Pt is hemodynamically stable and mentating appropriately. Discussed findings and plan with patient/guardian, who agrees with care plan. All questions answered. Return precautions discussed and outpatient follow up given.      Final Clinical Impressions(s) / ED Diagnoses   Final diagnoses:  AKI (acute kidney injury) (HCC)  Nausea vomiting and diarrhea    ED  Discharge Orders        Ordered    ondansetron (ZOFRAN ODT) 4 MG disintegrating tablet     03/31/17 2114       Phil Michels, Mardella Layman 03/31/17 2242    Doug Sou, MD 04/01/17 [phone]

## 2017-03-31 NOTE — ED Notes (Signed)
Pt attempt to supply urine sample unsuccessful.  Pt unable to provide enough for a sample.  Pt sts he will alert staff when he is able to try again.

## 2017-03-31 NOTE — Discharge Instructions (Signed)
Your kidney tests today are not normal. You need to drink plenty of water and have this rechecked by your primary care doctor in the next week. Do not take any NSAIDs (motrin, ibuprofen, advil, aleve, excedrin, aspirin, naproxen, goody's powder,  etc.) because they can further hurt your kidneys.  ° °Please follow with your primary care doctor in the next 2 days for a check-up. They must obtain records for further management.  ° °Do not hesitate to return to the Emergency Department for any new, worsening or concerning symptoms.  ° °

## 2017-03-31 NOTE — ED Triage Notes (Signed)
Patient states he has been having n/v/d since 2000 last night. Went to a walk in clinic and gave him nausea medication at 1300 and had no relief. Pt states he has thrown up and had diarrhea 8x since 0000. Sunday night patient went out to dinner for [place] and "it didn't taste so good". Felt okay until yesterday. States fever was 101.1 today.

## 2017-04-01 LAB — GASTROINTESTINAL PANEL BY PCR, STOOL (REPLACES STOOL CULTURE)
ADENOVIRUS F40/41: NOT DETECTED
ASTROVIRUS: NOT DETECTED
CAMPYLOBACTER SPECIES: NOT DETECTED
CYCLOSPORA CAYETANENSIS: NOT DETECTED
Cryptosporidium: NOT DETECTED
Entamoeba histolytica: NOT DETECTED
Enteroaggregative E coli (EAEC): NOT DETECTED
Enteropathogenic E coli (EPEC): NOT DETECTED
Enterotoxigenic E coli (ETEC): NOT DETECTED
Giardia lamblia: NOT DETECTED
Norovirus GI/GII: DETECTED — AB
PLESIMONAS SHIGELLOIDES: NOT DETECTED
ROTAVIRUS A: NOT DETECTED
Salmonella species: NOT DETECTED
Sapovirus (I, II, IV, and V): NOT DETECTED
Shiga like toxin producing E coli (STEC): NOT DETECTED
Shigella/Enteroinvasive E coli (EIEC): NOT DETECTED
VIBRIO SPECIES: NOT DETECTED
Vibrio cholerae: NOT DETECTED
Yersinia enterocolitica: NOT DETECTED

## 2019-10-28 ENCOUNTER — Ambulatory Visit
Admission: RE | Admit: 2019-10-28 | Discharge: 2019-10-28 | Disposition: A | Discharge status: Home / Self Care | Payer: No Typology Code available for payment source | Source: Ambulatory Visit | Attending: Family Medicine | Admitting: Family Medicine

## 2019-10-28 ENCOUNTER — Other Ambulatory Visit: Payer: Self-pay

## 2019-10-28 ENCOUNTER — Other Ambulatory Visit: Payer: Self-pay | Admitting: Family Medicine

## 2019-10-28 DIAGNOSIS — R0602 Shortness of breath: Secondary | ICD-10-CM

## 2020-10-26 ENCOUNTER — Encounter (HOSPITAL_BASED_OUTPATIENT_CLINIC_OR_DEPARTMENT_OTHER): Payer: Self-pay

## 2020-10-26 DIAGNOSIS — G4733 Obstructive sleep apnea (adult) (pediatric): Secondary | ICD-10-CM

## 2020-10-26 DIAGNOSIS — G4710 Hypersomnia, unspecified: Secondary | ICD-10-CM

## 2020-10-26 DIAGNOSIS — R0683 Snoring: Secondary | ICD-10-CM

## 2020-11-21 ENCOUNTER — Ambulatory Visit (HOSPITAL_BASED_OUTPATIENT_CLINIC_OR_DEPARTMENT_OTHER): Payer: No Typology Code available for payment source | Attending: Internal Medicine | Admitting: Internal Medicine

## 2020-11-21 ENCOUNTER — Other Ambulatory Visit: Payer: Self-pay

## 2020-11-21 DIAGNOSIS — G4733 Obstructive sleep apnea (adult) (pediatric): Secondary | ICD-10-CM | POA: Insufficient documentation

## 2020-11-21 DIAGNOSIS — G4731 Primary central sleep apnea: Secondary | ICD-10-CM | POA: Diagnosis not present

## 2020-11-21 DIAGNOSIS — G4710 Hypersomnia, unspecified: Secondary | ICD-10-CM

## 2020-11-21 DIAGNOSIS — R0683 Snoring: Secondary | ICD-10-CM | POA: Insufficient documentation

## 2020-12-15 NOTE — Procedures (Signed)
   NAME: Hector Reeves DATE OF BIRTH:  12-26-1975 MEDICAL RECORD NUMBER [id number]  LOCATION: [facility]  PHYSICIAN: Deretha Emory  DATE OF STUDY: 11/21/2020  SLEEP STUDY TYPE: Positive Airway Pressure Titration               REFERRING PHYSICIAN: Deretha Emory, MD  EPWORTH SLEEPINESS SCORE:  8 HEIGHT: 5\' 11"  (180.3 cm)  WEIGHT: 241 lb (109.3 kg)    Body mass index is 33.61 kg/m.  NECK SIZE: 17.5 in.  CLINICAL INFORMATION The patient was referred to the sleep center for evaluation of CPAP for established OSA. He has been on APAP at home and has had difficulty using any type of mask. He also has had some centrals emerging during treatment. HIs 95%ile pressure has been 10.5 in the few nights he has used it.   MEDICATIONS Patient self administered medications include: None  SLEEP STUDY TECHNIQUE The patient underwent an attended overnight polysomnography titration to assess the effects of cpap therapy. The following variables were monitored: EEG (C4-A1, C3-A2, O1-A2, O2-A1, F3-M2, F4-M1), EOG, submental and leg EMG, ECG, oxyhemoglobin saturation by pulse oximetry, thoracic and abdominal respiratory effort belts, nasal/oral airflow by pressure sensor, body position sensor and snoring sensor. CPAP pressure was titrated to eliminate apneas, hypopneas and oxygen desaturation.  TECHNICAL COMMENTS Comments added by Technician: None Comments added by Scorer: N/A  SLEEP ARCHITECTURE The study was initiated at 9:49:26 PM and terminated at 4:55:21 AM. Total recorded time was 425.9 minutes. EEG confirmed total sleep time was 275 minutes yielding a sleep efficiency of 64.6%. Sleep onset after lights out was 4.2 minutes with a REM latency of 111.0 minutes. The patient spent 22.5% of the night in stage N1 sleep, 61.6% in stage N2 sleep, 0.0% in stage N3 and 15.8% in REM. The Arousal Index was 38.8/hour.  RESPIRATORY PARAMETERS The overall AHI was 32.1 per hour, and the  RDI was 40.6 events/hour with a central apnea index of 15.9per hour. The most appropriate setting of BiPAP was IPAP/EPAP 19/15 cm H2O. At this setting, the sleep efficiency was 66 % and the patient was supine for 29%. The AHI was 17 events per hour, and the RDI was 28.1 events/hour (with 15.9 central events) and the arousal index was 10 per hour.The oxygen nadir was 84.0% during sleep.  LEG MOVEMENT DATA The total leg movements were 0 with a resulting leg movement index of 0.0. Associated arousal with leg movement index was 0.0.  CARDIAC DATA The underlying cardiac rhythm was most consistent with sinus rhythm. Mean heart rate during sleep was 53.6 bpm. Additional rhythm abnormalities include None.  IMPRESSIONS - Moderate Obstructive Sleep Apnea (OSA). Optimal pressure was not found. - Patient seemed to tolerate Evora mask  DIAGNOSIS - Obstructive Sleep Apnea (G47.33) - Central sleep apnea  RECOMMENDATIONS - Trial of APAP 4-16 or BiPAP therapy on 19/15 cm H2O with a Large size [employer] Full Face Mask Evora Full Mask mask and heated humidification. Both of these settings gave similar results (APAP was actually better). Patient's biggest complaint was inability to find a mask he could keep on. The Evora mask seemed to work. I will discuss with him his preference .  07-16-2005 Sleep specialist, American Board of Internal Medicine  ELECTRONICALLY SIGNED ON:  12/15/2020, 9:23 AM [facility] PH: [phone]   FX: [phone] ACCREDITED BY THE AMERICAN ACADEMY OF SLEEP MEDICINE

## 2021-08-21 ENCOUNTER — Encounter (HOSPITAL_BASED_OUTPATIENT_CLINIC_OR_DEPARTMENT_OTHER): Payer: Self-pay | Admitting: Emergency Medicine

## 2021-08-21 ENCOUNTER — Emergency Department (HOSPITAL_BASED_OUTPATIENT_CLINIC_OR_DEPARTMENT_OTHER)
Admission: EM | Admit: 2021-08-21 | Discharge: 2021-08-21 | Disposition: A | Discharge status: Home / Self Care | Payer: No Typology Code available for payment source | Attending: Emergency Medicine | Admitting: Emergency Medicine

## 2021-08-21 ENCOUNTER — Other Ambulatory Visit: Payer: Self-pay

## 2021-08-21 ENCOUNTER — Emergency Department (HOSPITAL_BASED_OUTPATIENT_CLINIC_OR_DEPARTMENT_OTHER): Payer: No Typology Code available for payment source

## 2021-08-21 DIAGNOSIS — R519 Headache, unspecified: Secondary | ICD-10-CM

## 2021-08-21 DIAGNOSIS — G459 Transient cerebral ischemic attack, unspecified: Secondary | ICD-10-CM | POA: Diagnosis not present

## 2021-08-21 DIAGNOSIS — R079 Chest pain, unspecified: Secondary | ICD-10-CM | POA: Insufficient documentation

## 2021-08-21 HISTORY — DX: Essential (primary) hypertension: I10

## 2021-08-21 HISTORY — DX: Obstructive sleep apnea (adult) (pediatric): G47.33

## 2021-08-21 HISTORY — DX: Gastro-esophageal reflux disease without esophagitis: K21.9

## 2021-08-21 LAB — TROPONIN I (HIGH SENSITIVITY)
Troponin I (High Sensitivity): 5 ng/L (ref <18)
Troponin I (High Sensitivity): 5 ng/L (ref <18)

## 2021-08-21 LAB — COMPREHENSIVE METABOLIC PANEL
ALT: 60 U/L — ABNORMAL HIGH (ref 0–44)
AST: 34 U/L (ref 15–41)
Albumin: 4.9 g/dL (ref 3.5–5.0)
Alkaline Phosphatase: 64 U/L (ref 38–126)
Anion gap: 13 (ref 5–15)
BUN: 15 mg/dL (ref 6–20)
CO2: 24 mmol/L (ref 22–32)
Calcium: 9.4 mg/dL (ref 8.9–10.3)
Chloride: 102 mmol/L (ref 98–111)
Creatinine, Ser: 1.33 mg/dL — ABNORMAL HIGH (ref 0.61–1.24)
GFR, Estimated: >60 mL/min (ref >60)
Glucose, Bld: 97 mg/dL (ref 70–99)
Potassium: 3.6 mmol/L (ref 3.5–5.1)
Sodium: 139 mmol/L (ref 135–145)
Total Bilirubin: 0.6 mg/dL (ref 0.3–1.2)
Total Protein: 8.1 g/dL (ref 6.5–8.1)

## 2021-08-21 LAB — DIFFERENTIAL
Abs Immature Granulocytes: 0.06 10*3/uL (ref 0.00–0.07)
Basophils Absolute: 0 10*3/uL (ref 0.0–0.1)
Basophils Relative: 1 %
Eosinophils Absolute: 0.1 10*3/uL (ref 0.0–0.5)
Eosinophils Relative: 1 %
Immature Granulocytes: 1 %
Lymphocytes Relative: 13 %
Lymphs Abs: 0.9 10*3/uL (ref 0.7–4.0)
Monocytes Absolute: 0.6 10*3/uL (ref 0.1–1.0)
Monocytes Relative: 8 %
Neutro Abs: 5.5 10*3/uL (ref 1.7–7.7)
Neutrophils Relative %: 76 %

## 2021-08-21 LAB — PROTIME-INR
INR: 1.1 (ref 0.8–1.2)
Prothrombin Time: 13.6 seconds (ref 11.4–15.2)

## 2021-08-21 LAB — CBC
HCT: 42.3 % (ref 39.0–52.0)
Hemoglobin: 14.5 g/dL (ref 13.0–17.0)
MCH: 29.9 pg (ref 26.0–34.0)
MCHC: 34.3 g/dL (ref 30.0–36.0)
MCV: 87.2 fL (ref 80.0–100.0)
Platelets: 221 10*3/uL (ref 150–400)
RBC: 4.85 MIL/uL (ref 4.22–5.81)
RDW: 13 % (ref 11.5–15.5)
WBC: 7.1 10*3/uL (ref 4.0–10.5)
nRBC: 0 % (ref 0.0–0.2)

## 2021-08-21 LAB — CBG MONITORING, ED: Glucose-Capillary: 102 mg/dL — ABNORMAL HIGH (ref 70–99)

## 2021-08-21 LAB — APTT: aPTT: 25 seconds (ref 24–36)

## 2021-08-21 LAB — ETHANOL: Alcohol, Ethyl (B): <10 mg/dL (ref <10)

## 2021-08-21 MED ORDER — ACETAMINOPHEN 500 MG PO TABS
1000.0000 mg | ORAL_TABLET | ORAL | Status: AC
Start: 2021-08-21 — End: 2021-08-21
  Administered 2021-08-21: 1000 mg via ORAL
  Filled 2021-08-21: qty 2

## 2021-08-21 MED ORDER — SODIUM CHLORIDE 0.9% FLUSH
3.0000 mL | Freq: Once | INTRAVENOUS | Status: DC
  Filled 2021-08-21: qty 3

## 2021-08-21 NOTE — Discharge Instructions (Signed)
Today you were seen in the emergency department for your headache and chest pain.    In the emergency department you had a CT scan, EKG, and lab work that was reassuring.    At home, please take Tylenol and Motrin as needed for your headache.    Follow-up with your primary doctor in 2-3 days regarding your visit.    Return immediately to the emergency department if you experience any of the following: Worsening headache, chest pain, fainting, slurred speech, weakness or numbness of your arms or legs, or any other concerning symptoms.    Thank you for visiting our Emergency Department. It was a pleasure taking care of you today.

## 2021-08-21 NOTE — ED Triage Notes (Signed)
Pt her e from MD office sent over for possible TIA workup , pt had an episode of slurred speech on sat along with some memory loss , no symptoms on arrival

## 2021-08-21 NOTE — ED Provider Notes (Signed)
[facility] EMERGENCY DEPT Provider Note   CSN: [id number] Arrival date & time: 08/21/21  1333     History {Add pertinent medical, surgical, social history, OB history to HPI:1} Chief Complaint  Patient presents with   Transient Ischemic Attack    Hector Reeves is a 46 y.o. male.  46 yo M who presents with ***. On Saturday was at a friend's house drinking etoh had 6 beers and several mixed drinks. Left the house and states that he doesn't remember the drive and was slurring his speech afterwards which is abnormal for him. After getting out of the shower was acting strange. Doesn't remember leaving his friend's house or the car ride. Threw up twice after getting home. Got better after a few hours. Says that he was fine on Sunday morning and the rest of the day. Did fine after that. Had a stressful day at work and had a dull ache at work. Feels like his response times were slowed but no slurred speech. Also with chest discomfort that started today. Describes it as a tightness. No exertional component, diaphoresis, or vomiting. No associated SOB.   Went to urgent care today who referred him in.         Home Medications Prior to Admission medications   Medication Sig Start Date End Date Taking? Authorizing Provider  HYDROcodone-acetaminophen (NORCO/VICODIN) 5-325 MG tablet Take 1 tablet by mouth every 4 (four) hours as needed. Patient not taking: Reported on 03/31/2017 01/22/17   Jacalyn Lefevre, MD  HYDROcodone-acetaminophen (NORCO/VICODIN) 5-325 MG tablet Take 1 tablet by mouth every 4 (four) hours as needed. Patient not taking: Reported on 03/31/2017 01/22/17   Jacalyn Lefevre, MD  ibuprofen (ADVIL,MOTRIN) 600 MG tablet Take 1 tablet (600 mg total) by mouth every 6 (six) hours as needed. Patient not taking: Reported on 03/31/2017 01/22/17   Jacalyn Lefevre, MD  methocarbamol (ROBAXIN) 500 MG tablet Take 1 tablet (500 mg total) by mouth 2 (two) times daily. Patient not  taking: Reported on 03/31/2017 12/17/15   Elpidio Anis, PA-C  naproxen sodium (ALEVE) 220 MG tablet Take 220 mg by mouth daily as needed (pain).    [provider]  omeprazole (PRILOSEC) 20 MG capsule Take 20 mg by mouth daily.    [provider]  ondansetron (ZOFRAN ODT) 4 MG disintegrating tablet 4mg  ODT q4 hours prn nausea/vomit 03/31/17   Pisciotta, 04/02/17, PA-C      Allergies    Milk-related compounds and Wheat bran    Review of Systems   Review of Systems  Physical Exam Updated Vital Signs BP (!) 134/90   Pulse 75   Temp 99.2 F (37.3 C)   Resp (!) 22   Ht 5\' 11"  (1.803 m)   Wt 109.3 kg   SpO2 98%   BMI 33.61 kg/m  Physical Exam  ED Results / Procedures / Treatments   Labs (all labs ordered are listed, but only abnormal results are displayed) Labs Reviewed  COMPREHENSIVE METABOLIC PANEL - Abnormal; Notable for the following components:      Result Value   Creatinine, Ser 1.33 (*)    ALT 60 (*)    All other components within normal limits  CBG MONITORING, ED - Abnormal; Notable for the following components:   Glucose-Capillary 102 (*)    All other components within normal limits  PROTIME-INR  APTT  CBC  DIFFERENTIAL  ETHANOL  TROPONIN I (HIGH SENSITIVITY)  TROPONIN I (HIGH SENSITIVITY)    EKG None  Radiology CT  HEAD WO CONTRAST  Result Date: 08/21/2021 CLINICAL DATA:  TIA. EXAM: CT HEAD WITHOUT CONTRAST TECHNIQUE: Contiguous axial images were obtained from the base of the skull through the vertex without intravenous contrast. RADIATION DOSE REDUCTION: This exam was performed according to the departmental dose-optimization program which includes automated exposure control, adjustment of the mA and/or kV according to patient size and/or use of iterative reconstruction technique. COMPARISON:  None Available. FINDINGS: Brain: No evidence of acute infarction, hemorrhage, hydrocephalus, extra-axial collection or mass lesion/mass effect. Vascular: No  hyperdense vessel identified. Skull: No acute fracture. Sinuses/Orbits: Clear sinuses.  No acute orbital findings. Other: No mastoid effusions. IMPRESSION: No evidence of acute intracranial abnormality. Electronically Signed   By: Feliberto Harts M.D.   On: 08/21/2021 14:46    Procedures Procedures  {Document cardiac monitor, telemetry assessment procedure when appropriate:1}  Medications Ordered in ED Medications  sodium chloride flush (NS) 0.9 % injection 3 mL (has no administration in time range)    ED Course/ Medical Decision Making/ A&P                           Medical Decision Making Amount and/or Complexity of Data Reviewed Labs: ordered. Radiology: ordered.   ***  {Document critical care time when appropriate:1} {Document review of labs and clinical decision tools ie heart score, Chads2Vasc2 etc:1}  {Document your independent review of radiology images, and any outside records:1} {Document your discussion with family members, caretakers, and with consultants:1} {Document social determinants of health affecting pt's care:1} {Document your decision making why or why not admission, treatments were needed:1} Final Clinical Impression(s) / ED Diagnoses Final diagnoses:  None    Rx / DC Orders ED Discharge Orders     None

## 2021-08-22 ENCOUNTER — Encounter (HOSPITAL_BASED_OUTPATIENT_CLINIC_OR_DEPARTMENT_OTHER): Payer: Self-pay | Admitting: Emergency Medicine

## 2021-08-26 ENCOUNTER — Other Ambulatory Visit: Payer: Self-pay | Admitting: Family Medicine

## 2021-08-26 DIAGNOSIS — G454 Transient global amnesia: Secondary | ICD-10-CM

## 2021-08-27 ENCOUNTER — Other Ambulatory Visit: Payer: Self-pay | Admitting: Family Medicine

## 2021-08-27 DIAGNOSIS — G454 Transient global amnesia: Secondary | ICD-10-CM

## 2021-09-02 ENCOUNTER — Ambulatory Visit
Admission: RE | Admit: 2021-09-02 | Discharge: 2021-09-02 | Disposition: A | Discharge status: Home / Self Care | Payer: No Typology Code available for payment source | Source: Ambulatory Visit | Attending: Family Medicine | Admitting: Family Medicine

## 2021-09-02 DIAGNOSIS — G454 Transient global amnesia: Secondary | ICD-10-CM
